# Patient Record
Sex: Male | Born: 1965 | Race: Black or African American | Hispanic: No | Marital: Married | Smoking: Never smoker
Health system: Southern US, Community
[De-identification: ages and names within clinical notes are randomized; demographics above are authoritative.]

## PROBLEM LIST (undated history)

## (undated) ENCOUNTER — Ambulatory Visit (HOSPITAL_COMMUNITY): Payer: BLUE CROSS/BLUE SHIELD | Source: Home / Self Care

## (undated) DIAGNOSIS — K219 Gastro-esophageal reflux disease without esophagitis: Secondary | ICD-10-CM

## (undated) DIAGNOSIS — I10 Essential (primary) hypertension: Secondary | ICD-10-CM

## (undated) DIAGNOSIS — F419 Anxiety disorder, unspecified: Secondary | ICD-10-CM

---

## 2000-05-25 ENCOUNTER — Encounter: Payer: Self-pay | Admitting: Family Medicine

## 2000-05-25 ENCOUNTER — Ambulatory Visit (HOSPITAL_COMMUNITY): Admission: RE | Admit: 2000-05-25 | Discharge: 2000-05-25 | Payer: Self-pay | Admitting: Family Medicine

## 2001-11-18 ENCOUNTER — Ambulatory Visit (HOSPITAL_COMMUNITY): Admission: RE | Admit: 2001-11-18 | Discharge: 2001-11-18 | Payer: Self-pay | Admitting: Family Medicine

## 2001-11-18 ENCOUNTER — Encounter: Payer: Self-pay | Admitting: Family Medicine

## 2011-09-17 ENCOUNTER — Inpatient Hospital Stay (HOSPITAL_BASED_OUTPATIENT_CLINIC_OR_DEPARTMENT_OTHER)
Admission: EM | Admit: 2011-09-17 | Discharge: 2011-09-18 | DRG: 183 | Disposition: A | Discharge status: Home / Self Care | Payer: BC Managed Care – PPO | Attending: Cardiology | Admitting: Cardiology

## 2011-09-17 ENCOUNTER — Emergency Department (HOSPITAL_BASED_OUTPATIENT_CLINIC_OR_DEPARTMENT_OTHER): Payer: BC Managed Care – PPO

## 2011-09-17 ENCOUNTER — Encounter (HOSPITAL_BASED_OUTPATIENT_CLINIC_OR_DEPARTMENT_OTHER): Payer: Self-pay | Admitting: Family Medicine

## 2011-09-17 DIAGNOSIS — R7989 Other specified abnormal findings of blood chemistry: Secondary | ICD-10-CM | POA: Diagnosis present

## 2011-09-17 DIAGNOSIS — R079 Chest pain, unspecified: Secondary | ICD-10-CM

## 2011-09-17 DIAGNOSIS — Z8612 Personal history of poliomyelitis: Secondary | ICD-10-CM

## 2011-09-17 DIAGNOSIS — K219 Gastro-esophageal reflux disease without esophagitis: Principal | ICD-10-CM | POA: Diagnosis present

## 2011-09-17 DIAGNOSIS — E7800 Pure hypercholesterolemia, unspecified: Secondary | ICD-10-CM | POA: Diagnosis present

## 2011-09-17 HISTORY — DX: Gastro-esophageal reflux disease without esophagitis: K21.9

## 2011-09-17 LAB — APTT: aPTT: 117 seconds — ABNORMAL HIGH (ref 24–37)

## 2011-09-17 LAB — CBC WITH DIFFERENTIAL/PLATELET
Basophils Absolute: 0 10*3/uL (ref 0.0–0.1)
Basophils Absolute: 0 10*3/uL (ref 0.0–0.1)
Basophils Relative: 0 % (ref 0–1)
Eosinophils Absolute: 0.1 10*3/uL (ref 0.0–0.7)
Eosinophils Relative: 2 % (ref 0–5)
Eosinophils Relative: 1 % (ref 0–5)
HCT: 37.6 % — ABNORMAL LOW (ref 39.0–52.0)
HCT: 37.2 % — ABNORMAL LOW (ref 39.0–52.0)
Hemoglobin: 13.5 g/dL (ref 13.0–17.0)
Lymphocytes Relative: 38 % (ref 12–46)
Lymphs Abs: 2 10*3/uL (ref 0.7–4.0)
MCH: 27.2 pg (ref 26.0–34.0)
MCHC: 35.9 g/dL (ref 30.0–36.0)
MCV: 75.7 fL — ABNORMAL LOW (ref 78.0–100.0)
MCV: 77.2 fL — ABNORMAL LOW (ref 78.0–100.0)
Monocytes Absolute: 0.6 10*3/uL (ref 0.1–1.0)
Monocytes Absolute: 0.3 10*3/uL (ref 0.1–1.0)
Monocytes Relative: 10 % (ref 3–12)
Neutro Abs: 2.9 10*3/uL (ref 1.7–7.7)
Neutro Abs: 2.8 10*3/uL (ref 1.7–7.7)
Platelets: 224 10*3/uL (ref 150–400)
RBC: 4.82 MIL/uL (ref 4.22–5.81)
RDW: 12.5 % (ref 11.5–15.5)
RDW: 12.8 % (ref 11.5–15.5)
WBC: 5.2 10*3/uL (ref 4.0–10.5)

## 2011-09-17 LAB — COMPREHENSIVE METABOLIC PANEL
AST: 33 U/L (ref 0–37)
Albumin: 4.3 g/dL (ref 3.5–5.2)
Albumin: 3.8 g/dL (ref 3.5–5.2)
Alkaline Phosphatase: 60 U/L (ref 39–117)
Alkaline Phosphatase: 50 U/L (ref 39–117)
BUN: 10 mg/dL (ref 6–23)
CO2: 29 mEq/L (ref 19–32)
CO2: 27 mEq/L (ref 19–32)
Chloride: 100 mEq/L (ref 96–112)
Chloride: 105 mEq/L (ref 96–112)
Creatinine, Ser: 0.8 mg/dL (ref 0.50–1.35)
Creatinine, Ser: 0.64 mg/dL (ref 0.50–1.35)
GFR calc non Af Amer: >90 mL/min (ref >90)
GFR calc non Af Amer: >90 mL/min (ref >90)
Glucose, Bld: 79 mg/dL (ref 70–99)
Potassium: 3.9 mEq/L (ref 3.5–5.1)
Potassium: 3.9 mEq/L (ref 3.5–5.1)
Total Bilirubin: 0.4 mg/dL (ref 0.3–1.2)
Total Bilirubin: 0.5 mg/dL (ref 0.3–1.2)

## 2011-09-17 LAB — CARDIAC PANEL(CRET KIN+CKTOT+MB+TROPI): Relative Index: 0.9 (ref 0.0–2.5)

## 2011-09-17 LAB — LIPASE, BLOOD: Lipase: 30 U/L (ref 11–59)

## 2011-09-17 MED ORDER — NITROGLYCERIN 0.4 MG SL SUBL
0.4000 mg | SUBLINGUAL_TABLET | SUBLINGUAL | Status: DC | PRN
  Administered 2011-09-17 (×2): 0.4 mg via SUBLINGUAL
  Filled 2011-09-17: qty 25

## 2011-09-17 MED ORDER — ASPIRIN 81 MG PO CHEW
324.0000 mg | CHEWABLE_TABLET | ORAL | Status: AC
  Administered 2011-09-17: 324 mg via ORAL
  Filled 2011-09-17: qty 4

## 2011-09-17 MED ORDER — NITROGLYCERIN 0.4 MG SL SUBL: 0.4000 mg | SUBLINGUAL_TABLET | SUBLINGUAL | Status: DC | PRN

## 2011-09-17 MED ORDER — ACETAMINOPHEN 325 MG PO TABS: 650.0000 mg | ORAL_TABLET | ORAL | Status: DC | PRN

## 2011-09-17 MED ORDER — ASPIRIN EC 81 MG PO TBEC
81.0000 mg | DELAYED_RELEASE_TABLET | Freq: Every day | ORAL | Status: DC
  Filled 2011-09-17: qty 1

## 2011-09-17 MED ORDER — ASPIRIN 81 MG PO CHEW
324.0000 mg | CHEWABLE_TABLET | Freq: Once | ORAL | Status: AC
  Administered 2011-09-17: 324 mg via ORAL
  Filled 2011-09-17: qty 4

## 2011-09-17 MED ORDER — SODIUM CHLORIDE 0.9 % IV SOLN
INTRAVENOUS | Status: DC
  Administered 2011-09-17: 22:00:00 via INTRAVENOUS

## 2011-09-17 MED ORDER — LORAZEPAM 2 MG/ML IJ SOLN
1.0000 mg | Freq: Once | INTRAMUSCULAR | Status: AC
  Administered 2011-09-17: 1 mg via INTRAVENOUS
  Filled 2011-09-17: qty 1

## 2011-09-17 MED ORDER — METOPROLOL TARTRATE 50 MG PO TABS
ORAL_TABLET | ORAL | Status: AC
  Administered 2011-09-17: 12.5 mg via ORAL
  Filled 2011-09-17: qty 1

## 2011-09-17 MED ORDER — NITROGLYCERIN IN D5W 200-5 MCG/ML-% IV SOLN
2.0000 ug/min | Freq: Once | INTRAVENOUS | Status: AC
  Administered 2011-09-17: 5 ug/min via INTRAVENOUS
  Filled 2011-09-17: qty 250

## 2011-09-17 MED ORDER — ATORVASTATIN CALCIUM 40 MG PO TABS
40.0000 mg | ORAL_TABLET | Freq: Every day | ORAL | Status: DC
  Filled 2011-09-17: qty 1

## 2011-09-17 MED ORDER — ASPIRIN 300 MG RE SUPP
300.0000 mg | RECTAL | Status: AC
  Filled 2011-09-17: qty 1

## 2011-09-17 MED ORDER — DIPHENHYDRAMINE HCL 50 MG/ML IJ SOLN: 25.0000 mg | Freq: Once | INTRAMUSCULAR | Status: DC

## 2011-09-17 MED ORDER — HEPARIN (PORCINE) IN NACL 100-0.45 UNIT/ML-% IJ SOLN
1350.0000 [IU]/h | INTRAMUSCULAR | Status: DC
  Administered 2011-09-17: 1000 [IU]/h via INTRAVENOUS
  Filled 2011-09-17 (×2): qty 250

## 2011-09-17 MED ORDER — HEPARIN BOLUS VIA INFUSION
4000.0000 [IU] | Freq: Once | INTRAVENOUS | Status: AC
  Administered 2011-09-17: 4000 [IU] via INTRAVENOUS

## 2011-09-17 MED ORDER — ONDANSETRON HCL 4 MG/2ML IJ SOLN: 4.0000 mg | Freq: Four times a day (QID) | INTRAMUSCULAR | Status: DC | PRN

## 2011-09-17 MED ORDER — METOPROLOL TARTRATE 12.5 MG HALF TABLET
12.5000 mg | ORAL_TABLET | Freq: Once | ORAL | Status: AC
  Administered 2011-09-17: 12.5 mg via ORAL
  Filled 2011-09-17: qty 1

## 2011-09-17 NOTE — ED Notes (Addendum)
Pt c/o intermittent chest pain x 5 days that he describes as heaviness radiating down left arm. Pt sts he has h/o gerd and has been taking prilosec which helps pain but does not "fix". Pt denies shob, n/v, dizziness. Pt sts eating makes pain better and pain is worse when hungry.

## 2011-09-17 NOTE — Progress Notes (Signed)
ANTICOAGULATION CONSULT NOTE - Initial Consult  Pharmacy Consult for UFH Indication: USAP  No Known Allergies  Patient Measurements: Height: 5\' 9"  (175.3 cm) Weight: 208 lb (94.348 kg) IBW/kg (Calculated) : 70.7  Heparin Dosing Weight: 90kg  Vital Signs: Temp: 98.2 F (36.8 C) (08/21 1340) Temp src: Oral (08/21 1340) BP: 107/72 mmHg (08/21 1340) Pulse Rate: 65  (08/21 1340)  Labs:  Basename 09/17/11 1152  HGB 13.5  HCT 37.6*  PLT 249  APTT --  LABPROT --  INR --  HEPARINUNFRC --  CREATININE 0.80  CKTOTAL 1330*  CKMB 12.7*  TROPONINI <0.30    Estimated Creatinine Clearance: 132.1 ml/min (by C-G formula based on Cr of 0.8).   Medical History: Past Medical History  Diagnosis Date  . GERD (gastroesophageal reflux disease)     Medications:   (Not in a hospital admission)  Assessment: 46 y/o male patient admitted with chest pain requiring anticoagulation for r/o MI.  Goal of Therapy:  Heparin level 0.3-0.7 units/ml Monitor platelets by anticoagulation protocol: Yes   Plan:  Heparin 4000 unit IV bolus followed by infusion at 1000 units/hr and check 6 hour level. Daily cbc and heparin level.  Verlene Mayer, PharmD, BCPS Pager [phone] 09/17/2011,3:31 PM

## 2011-09-17 NOTE — H&P (Signed)
[name] is an 46 y.o. male.   Chief Complaint: Chest pain/left arm pain HPI: Patient is 46 year old male with past medical history significant for GERD was transferred from [facility] med center for evaluation of chest pain. Patient complains of retrosternal chest pain described as pressure tightness off and on since last to 3 days also associated epigastric pain initially took Prilosec with relief but again gallop retrosternal chest pressure tightness grade 8/10 radiating to back of the neck and left arm received sublingual nitroglycerin with relief of chest pain. Patient denies any shortness of breath. Denies any palpitation lightheadedness or syncope. Denies nausea vomiting sweating. Denies any cardiac workup in the past. Patient denies history of exertional chest pain in the past. Denies cough fever chills. Denies relation of chest pain to food breathing or movement.   Past Medical History  Diagnosis Date  . GERD (gastroesophageal reflux disease)     History reviewed. No pertinent past surgical history.  No family history on file. Social History:  reports that he has never smoked. He does not have any smokeless tobacco history on file. He reports that he drinks alcohol. He reports that he does not use illicit drugs.  Allergies: No Known Allergies  Medications Prior to Admission  Medication Sig Dispense Refill  . calcium carbonate (TUMS - DOSED IN MG ELEMENTAL CALCIUM) 500 MG chewable tablet Chew 3 tablets by mouth daily as needed. Stomach upset      . ibuprofen (ADVIL,MOTRIN) 200 MG tablet Take 800 mg by mouth daily as needed. pain      . omeprazole (PRILOSEC) 10 MG capsule Take 10 mg by mouth daily.      . vitamin C (ASCORBIC ACID) 500 MG tablet Take 1,000 mg by mouth daily.        Results for orders placed during the hospital encounter of 09/17/11 (from the past 48 hour(s))  CBC WITH DIFFERENTIAL     Status: Abnormal   Collection Time   09/17/11 11:52 AM      Component Value  Range Comment   WBC 5.6  4.0 - 10.5 K/uL    RBC 4.97  4.22 - 5.81 MIL/uL    Hemoglobin 13.5  13.0 - 17.0 g/dL    HCT 16.1 (*) 09.6 - 52.0 %    MCV 75.7 (*) 78.0 - 100.0 fL    MCH 27.2  26.0 - 34.0 pg    MCHC 35.9  30.0 - 36.0 g/dL    RDW 04.5  40.9 - 81.1 %    Platelets 249  150 - 400 K/uL    Neutrophils Relative 51  43 - 77 %    Neutro Abs 2.9  1.7 - 7.7 K/uL    Lymphocytes Relative 36  12 - 46 %    Lymphs Abs 2.0  0.7 - 4.0 K/uL    Monocytes Relative 10  3 - 12 %    Monocytes Absolute 0.6  0.1 - 1.0 K/uL    Eosinophils Relative 2  0 - 5 %    Eosinophils Absolute 0.1  0.0 - 0.7 K/uL    Basophils Relative 0  0 - 1 %    Basophils Absolute 0.0  0.0 - 0.1 K/uL   TROPONIN I     Status: Normal   Collection Time   09/17/11 11:52 AM      Component Value Range Comment   Troponin I <0.30  <0.30 ng/mL   CK TOTAL AND CKMB     Status: Abnormal  Collection Time   09/17/11 11:52 AM      Component Value Range Comment   Total CK 1330 (*) 7 - 232 U/L    CK, MB 12.7 (*) 0.3 - 4.0 ng/mL    Relative Index 1.0  0.0 - 2.5   COMPREHENSIVE METABOLIC PANEL     Status: Abnormal   Collection Time   09/17/11 11:52 AM      Component Value Range Comment   Sodium 137  135 - 145 mEq/L    Potassium 3.9  3.5 - 5.1 mEq/L    Chloride 100  96 - 112 mEq/L    CO2 29  19 - 32 mEq/L    Glucose, Bld 79  70 - 99 mg/dL    BUN 10  6 - 23 mg/dL    Creatinine, Ser 4.78  0.50 - 1.35 mg/dL    Calcium 29.5  8.4 - 10.5 mg/dL    Total Protein 7.4  6.0 - 8.3 g/dL    Albumin 4.3  3.5 - 5.2 g/dL    AST 39 (*) 0 - 37 U/L    ALT 41  0 - 53 U/L    Alkaline Phosphatase 60  39 - 117 U/L    Total Bilirubin 0.4  0.3 - 1.2 mg/dL    GFR calc non Af Amer >90  >90 mL/min    GFR calc Af Amer >90  >90 mL/min   LIPASE, BLOOD     Status: Normal   Collection Time   09/17/11 11:55 AM      Component Value Range Comment   Lipase 30  11 - 59 U/L    Dg Chest 2 View  09/17/2011  *RADIOLOGY REPORT*  Clinical Data: Chest pain, tightness.   CHEST - 2 VIEW  Comparison: None  Findings: Heart and mediastinal contours are within normal limits. No focal opacities or effusions.  No acute bony abnormality.  IMPRESSION: No active cardiopulmonary disease.   Original Report Authenticated By: Cyndie Chime, M.D.     Review of Systems  Constitutional: Negative for fever, chills and weight loss.  HENT: Negative for hearing loss.   Eyes: Negative for blurred vision and double vision.  Respiratory: Negative for cough, hemoptysis and sputum production.   Cardiovascular: Positive for chest pain and palpitations. Negative for orthopnea, claudication and leg swelling.  Gastrointestinal: Positive for heartburn, nausea and abdominal pain. Negative for vomiting.  Genitourinary: Negative for dysuria.  Skin: Negative for rash.  Neurological: Negative for dizziness and headaches.    Blood pressure 135/84, pulse 72, temperature 98.2 F (36.8 C), temperature source Oral, resp. rate 17, height 5\' 9"  (1.753 m), weight 94.348 kg (208 lb), SpO2 100.00%. Physical Exam  Constitutional: He is oriented to person, place, and time. He appears well-developed and well-nourished. No distress.  HENT:  Head: Normocephalic and atraumatic.  Eyes: Conjunctivae are normal. Pupils are equal, round, and reactive to light. Left eye exhibits no discharge. No scleral icterus.  Neck: Normal range of motion. Neck supple. No JVD present. No tracheal deviation present. No thyromegaly present.  Cardiovascular: Normal rate, regular rhythm and normal heart sounds.  Exam reveals no friction rub.   No murmur heard. Respiratory: Effort normal and breath sounds normal. No respiratory distress. He has no wheezes. He has no rales.  GI: Soft. Bowel sounds are normal. He exhibits no distension. There is no tenderness. There is no rebound and no guarding.  Musculoskeletal: He exhibits no edema and no tenderness.  Neurological: He is alert  and oriented to person, place, and time.      Assessment/Plan Unstable angina rule out MI GERD Plan As per orders Schedule for a stress Myoview in a.m.  Robynn Pane 09/17/2011, 6:48 PM

## 2011-09-17 NOTE — ED Provider Notes (Addendum)
History     CSN: [id number]  Arrival date & time 09/17/11  1122   First MD Initiated Contact with Patient 09/17/11 1159      Chief Complaint  Patient presents with  . Chest Pain    (Consider location/radiation/quality/duration/timing/severity/associated sxs/prior treatment) HPI Patient complaining of chest pain described as heaviness which radiates to his back and down his left arm. It began on Saturday and waxed and waned until yesterday. The pain improved yesterday after taking Prilosec. It has returned and he has taken Prilosec again and is currently at a 6/10 pain that was previously at an 8/10. He denies any shortness of breath or sweating. He thinks it has been increased by certain foods but cannot tell whether it has been or not. He spoke with his physician today and was told to come into the hospital. He denies any history of high blood pressure, high cholesterol, or known family history of heart disease although his father has passed away and he does not know the details of his medical history. Patient is not a smoker he does use occasional alcohol  Past Medical History  Diagnosis Date  . GERD (gastroesophageal reflux disease)     History reviewed. No pertinent past surgical history.  No family history on file.  History  Substance Use Topics  . Smoking status: Never Smoker   . Smokeless tobacco: Not on file  . Alcohol Use: Yes      Review of Systems  All other systems reviewed and are negative.    Allergies  Review of patient's allergies indicates no known allergies.  Home Medications   Current Outpatient Rx  Name Route Sig Dispense Refill  . TUMS E-X PO Oral Take by mouth.    . OMEPRAZOLE 40 MG PO CPDR Oral Take 40 mg by mouth daily.      BP 107/72  Pulse 65  Temp 98.2 F (36.8 C) (Oral)  Resp 16  Ht 5\' 9"  (1.753 m)  Wt 208 lb (94.348 kg)  BMI 30.72 kg/m2  SpO2 100%  Physical Exam  Nursing note and vitals reviewed. Constitutional: He is  oriented to person, place, and time. He appears well-developed and well-nourished.  HENT:  Head: Normocephalic and atraumatic.  Right Ear: External ear normal.  Left Ear: External ear normal.  Nose: Nose normal.  Mouth/Throat: Oropharynx is clear and moist.  Eyes: Conjunctivae and EOM are normal. Pupils are equal, round, and reactive to light.  Neck: Normal range of motion. Neck supple.  Cardiovascular: Normal rate, regular rhythm, normal heart sounds and intact distal pulses.   Pulmonary/Chest: Effort normal and breath sounds normal.  Abdominal: Soft. Bowel sounds are normal.  Musculoskeletal: Normal range of motion.  Neurological: He is alert and oriented to person, place, and time. He has normal reflexes.  Skin: Skin is warm and dry.  Psychiatric: He has a normal mood and affect. His behavior is normal. Thought content normal.    ED Course  Procedures (including critical care time)  Labs Reviewed  CBC WITH DIFFERENTIAL - Abnormal; Notable for the following:    HCT 37.6 (*)     MCV 75.7 (*)     All other components within normal limits  CK TOTAL AND CKMB - Abnormal; Notable for the following:    Total CK 1330 (*)     CK, MB 12.7 (*)     All other components within normal limits  COMPREHENSIVE METABOLIC PANEL - Abnormal; Notable for the following:    AST 39 (*)  All other components within normal limits  TROPONIN I  LIPASE, BLOOD   Dg Chest 2 View  09/17/2011  *RADIOLOGY REPORT*  Clinical Data: Chest pain, tightness.  CHEST - 2 VIEW  Comparison: None  Findings: Heart and mediastinal contours are within normal limits. No focal opacities or effusions.  No acute bony abnormality.  IMPRESSION: No active cardiopulmonary disease.   Original Report Authenticated By: Cyndie Chime, M.D.      No diagnosis found.   Date: 11/04/2011  Rate: 69  Rhythm: normal sinus rhythm  QRS Axis: normal  Intervals: normal  ST/T Wave abnormalities: ST elevations diffusely  Conduction  Disutrbances:none  Narrative Interpretation:   Old EKG Reviewed: none available    MDM    Patient with intermittent chest pain concerning for cad, but nonexertional and not brought on by exertion.  Patient pain free after nitro here, ck and mb elevated but troponin normal.  Discussed with Dr. Sharyn Lull and nitro, drip, heparin, and lopressor ordered.  Plan transfer for further assessment by cardiologist.       Hilario Quarry, MD 09/19/11 1610  Hilario Quarry, MD 11/04/11 [id number]

## 2011-09-18 ENCOUNTER — Inpatient Hospital Stay (HOSPITAL_COMMUNITY): Payer: BC Managed Care – PPO

## 2011-09-18 LAB — CARDIAC PANEL(CRET KIN+CKTOT+MB+TROPI)
CK, MB: 7.4 ng/mL (ref 0.3–4.0)
Relative Index: 1 (ref 0.0–2.5)
Total CK: 757 U/L — ABNORMAL HIGH (ref 7–232)
Total CK: 627 U/L — ABNORMAL HIGH (ref 7–232)
Troponin I: <0.3 ng/mL (ref <0.30)

## 2011-09-18 LAB — CBC
HCT: 38.9 % — ABNORMAL LOW (ref 39.0–52.0)
Hemoglobin: 13.5 g/dL (ref 13.0–17.0)
MCH: 27.2 pg (ref 26.0–34.0)
MCHC: 34.7 g/dL (ref 30.0–36.0)
MCV: 78.3 fL (ref 78.0–100.0)
RDW: 12.8 % (ref 11.5–15.5)

## 2011-09-18 LAB — TSH: TSH: 2.723 u[IU]/mL (ref 0.350–4.500)

## 2011-09-18 MED ORDER — ATORVASTATIN CALCIUM 40 MG PO TABS: 40.0000 mg | ORAL_TABLET | Freq: Every day | ORAL | Status: AC

## 2011-09-18 MED ORDER — HEPARIN BOLUS VIA INFUSION
3000.0000 [IU] | Freq: Once | INTRAVENOUS | Status: DC
  Filled 2011-09-18: qty 3000

## 2011-09-18 MED ORDER — OMEPRAZOLE 10 MG PO CPDR: 40.0000 mg | DELAYED_RELEASE_CAPSULE | Freq: Every day | ORAL | Status: DC

## 2011-09-18 MED ORDER — TECHNETIUM TC 99M TETROFOSMIN IV KIT
10.0000 | PACK | Freq: Once | INTRAVENOUS | Status: AC | PRN
  Administered 2011-09-18: 10 via INTRAVENOUS

## 2011-09-18 MED ORDER — TECHNETIUM TC 99M TETROFOSMIN IV KIT
30.0000 | PACK | Freq: Once | INTRAVENOUS | Status: AC | PRN
  Administered 2011-09-18: 30 via INTRAVENOUS

## 2011-09-18 MED ORDER — REGADENOSON 0.4 MG/5ML IV SOLN
0.4000 mg | Freq: Once | INTRAVENOUS | Status: AC
  Administered 2011-09-18: 0.4 mg via INTRAVENOUS

## 2011-09-18 MED ORDER — ASPIRIN 81 MG PO TBEC: 81.0000 mg | DELAYED_RELEASE_TABLET | Freq: Every day | ORAL | Status: AC

## 2011-09-18 NOTE — Progress Notes (Signed)
ANTICOAGULATION CONSULT NOTE - Follow Up Consult  Pharmacy Consult for Heparin Indication: Unstable angina pain, rule out MI  Allergies No Known Allergies  Patient Measurements: Heparin Dosing Weight:90 kg  Vital Signs: BP 111/68  Pulse 67  Temp 97.9 F (36.6 C) (Oral)  Resp 13  Ht 5\' 9"  (1.753 m)  Wt 210 lb 1.6 oz (95.3 kg)  BMI 31.03 kg/m2  SpO2 100%  Labs:  Basename 09/18/11 0852 09/18/11 0815 09/18/11 0814 09/18/11 0103 09/17/11 1923 09/17/11 1922 09/17/11 1152  HGB 13.5 -- -- -- 13.1 -- --  HCT 38.9* -- -- -- 37.2* -- 37.6*  PLT 205 -- -- -- 224 -- 249  APTT -- -- -- -- 117* -- --  LABPROT -- -- -- -- 13.8 -- --  INR -- -- -- -- 1.04 -- --  HEPARINUNFRC -- -- <0.10* -- -- -- --  CREATININE -- -- -- -- 0.64 -- 0.80  CKTOTAL -- 627* -- 757* -- 920* --  CKMB -- 7.2* -- 7.4* -- 8.6* --  TROPONINI -- <0.30 -- <0.30 -- <0.30 --   Estimated Creatinine Clearance: 132.8 ml/min (by C-G formula based on Cr of 0.64).  Medications:  Scheduled:    . aspirin EC  81 mg Oral Daily  . atorvastatin  40 mg Oral q1800   Infusions:    . heparin 1,000 Units/hr (09/17/11 2142)    Assessment:  46 y/o male patient admitted with chest pain requiring anticoagulation for r/o MI.  Heparin level reported as < 0.1 unit/ml >> SUBtherapeutic  Goal of Therapy:   Heparin level 0.3-0.7 units/ml   Plan:   Give 3000 units bolus x 1  Increase heparin infusion to 1350 units/hr  Check anti-Xa level in 6 hours and daily while on heparin  Continue to monitor H&H and platelets  Josilynn Losh J  Pharm.D 09/18/2011, 9:48 AM

## 2011-09-18 NOTE — Progress Notes (Signed)
Subjective:  Denies further chest pain or shortness of breath nuclear stress test results pending  Objective:  Vital Signs in the last 24 hours: Temp:  [97.9 F (36.6 C)-98.3 F (36.8 C)] 97.9 F (36.6 C) (08/22 0747) Pulse Rate:  [56-77] 77  (08/22 0800) Resp:  [13-18] 16  (08/22 0800) BP: (100-135)/(54-87) 113/61 mmHg (08/22 1252) SpO2:  [99 %-100 %] 100 % (08/22 0800) Weight:  [95.3 kg (210 lb 1.6 oz)] 95.3 kg (210 lb 1.6 oz) (08/22 0500)  Intake/Output from previous day: 08/21 0701 - 08/22 0700 In: 290 [P.O.:240; I.V.:50] Out: 625 [Urine:625] Intake/Output from this shift: Total I/O In: 240 [P.O.:240] Out: -   Physical Exam: Neck: no adenopathy, no carotid bruit, no JVD and supple, symmetrical, trachea midline Lungs: clear to auscultation bilaterally Heart: regular rate and rhythm, S1, S2 normal, no murmur, click, rub or gallop Abdomen: soft, non-tender; bowel sounds normal; no masses,  no organomegaly Extremities: extremities normal, atraumatic, no cyanosis or edema  Lab Results:  Basename 09/18/11 0852 09/17/11 1923  WBC 5.1 5.2  HGB 13.5 13.1  PLT 205 224    Basename 09/17/11 1923 09/17/11 1152  NA 141 137  K 3.9 3.9  CL 105 100  CO2 27 29  GLUCOSE 84 79  BUN 8 10  CREATININE 0.64 0.80    Basename 09/18/11 0815 09/18/11 0103  TROPONINI <0.30 <0.30   Hepatic Function Panel  Basename 09/17/11 1923  PROT 6.7  ALBUMIN 3.8  AST 33  ALT 36  ALKPHOS 50  BILITOT 0.5  BILIDIR --  IBILI --   No results found for this basename: CHOL in the last 72 hours No results found for this basename: PROTIME in the last 72 hours  Imaging: Imaging results have been reviewed and Dg Chest 2 View  09/17/2011  *RADIOLOGY REPORT*  Clinical Data: Chest pain, tightness.  CHEST - 2 VIEW  Comparison: None  Findings: Heart and mediastinal contours are within normal limits. No focal opacities or effusions.  No acute bony abnormality.  IMPRESSION: No active cardiopulmonary  disease.   Original Report Authenticated By: Cyndie Chime, M.D.    Nm Myocar Multi W/spect W/wall Motion / Ef  09/18/2011  *RADIOLOGY REPORT*  Clinical Data:  Chest pain  MYOCARDIAL IMAGING WITH SPECT (REST AND PHARMACOLOGIC-STRESS) GATED LEFT VENTRICULAR WALL MOTION STUDY LEFT VENTRICULAR EJECTION FRACTION  Technique:  Standard myocardial SPECT imaging was performed after resting intravenous injection of 10 mCi Tc-98m tetrofosmin. Subsequently, intravenous infusion of Lexiscan was performed under the supervision of the Cardiology staff.  At peak effect of the drug, 30 mCi Tc-61m tetrofosmin was injected intravenously and standard myocardial SPECT  imaging was performed.  Quantitative gated imaging was also performed to evaluate left ventricular wall motion, and estimate left ventricular ejection fraction.  Comparison:  None  Findings: SPECT imaging demonstrates no reversible or irreversible defects to suggest ischemia or infarct.  Quantitative gated analysis shows normal wall motion.  The resting left ventricular ejection fraction is 56% with end- diastolic volume of 87 ml and end-systolic volume of 39 ml.  IMPRESSION: No evidence of ischemia or infarct.  Ejection fraction 56%.   Original Report Authenticated By: Cyndie Chime, M.D.     Cardiac Studies:  Assessment/Plan:  Unstable angina MI ruled out Elevated CPK MB and CK secondary to noncardiac source GERD  History of polio in the past  Plan Check stress test results if negative for ischemia will DC home  LOS: 1 day  Robynn Pane 09/18/2011, 3:49 PM

## 2011-09-18 NOTE — Discharge Summary (Signed)
  Discharge summary dictated on 09/18/2011 dictation number is [id number]

## 2011-09-19 NOTE — Discharge Summary (Signed)
[name], [name] NO.:  [id number]  MEDICAL RECORD NO.:  [id number]  LOCATION:  2507                         FACILITY:  [facility]  PHYSICIAN:  Eduardo Osier. Sharyn Lull, M.D. DATE OF BIRTH:  29-Mar-1965  DATE OF ADMISSION:  09/17/2011 DATE OF DISCHARGE:  09/18/2011                              DISCHARGE SUMMARY   ADMITTING DIAGNOSES: 1. Unstable angina, rule out myocardial infarction. 2. Gastroesophageal reflux disease.  DISCHARGE DIAGNOSES: 1. Status post chest pain, myocardial infarction ruled out, negative     nuclear stress test, rule out gastroesophageal reflux disease. 2. Elevated CPK, MB and CK secondary to noncardiac source. 3. Hypercholesteremia. 4. Gastroesophageal reflux disease. 5. History of polio in the past.  DISCHARGE HOME MEDICATIONS: 1. Enteric-coated aspirin 81 mg 1 tablet daily. 2. Atorvastatin 40 mg 1 tablet daily. 3. Omeprazole 40 mg daily. 4. Calcium carbonate 500 mg as before. 5. Vitamin C 500 mg as before.  The patient has been advised to stop     ibuprofen.  DIET:  Low salt, low cholesterol.  ACTIVITY:  As tolerated.  CONDITION AT DISCHARGE:  Stable.  BRIEF HISTORY AND HOSPITAL COURSE:  [name] is a 46 year old male with past medical history significant for GERD, who was transferred from [facility] for evaluation of chest pain.  The patient complained of retrosternal chest pain, described as pressure, tightness off and on for last 3 days, also associated epigastric pain, initially took Prilosec with some relief.  Again, he developed retrosternal chest pressure, tightness, grade 8/10 radiating to the back of the neck and left arm, received sublingual nitro with relief of chest pain.  The patient denies any shortness of breath.  Denies palpitation, lightheadedness, or syncope.  Denies any nausea, vomiting, or diaphoresis.  Denies any cardiac workup in the past.  Denies history of exertional chest pain in the past.   Denies cough, fever, or chills. Denies relation of chest pain to food, breathing, or movement.  PAST MEDICAL HISTORY:  As above.  PAST SURGICAL HISTORY:  None.  SOCIAL HISTORY:  No history of smoking or alcohol abuse.  MEDICATION AT HOME:  He takes calcium carbonate, ibuprofen, omeprazole, and vitamin C.  PHYSICAL EXAMINATION:  VITAL SIGNS:  His blood pressure was 135/84, pulse was 72.  He was afebrile. HEENT:  Conjunctiva was pink. NECK:  Supple.  No JVD.  No bruit. LUNGS:  Clear to auscultation without rhonchi or rales. CARDIOVASCULAR:  S1, S2 was normal.  There was no murmur, or S3 gallop or S4 gallop. ABDOMEN:  Soft.  Bowel sounds were present.  Nontender.  There was no mass or organomegaly. EXTREMITIES:  There was no clubbing, cyanosis, or edema. NEUROLOGIC:  Grossly intact.  LABORATORY DATA:  Sodium was 139, potassium 3.9, BUN 10, creatinine 0.80.  His four sets of troponin-I were normal; however, CK was elevated at 1330, MB 12.7.  Second set; CK 920, MB 8.6.  Third set; CK 757, MB 7.4, relative index in normal range.  Fourth set; CK 627, MB 7.2. Hemoglobin was 13.5, hematocrit 37.6, white count of 5.6 with no shift to the left.  Lipid panel is still pending, not available.  His chest x- ray showed no  active cardiopulmonary disease.  EKG showed normal sinus rhythm with LVH with early repolarization changes.  Nuclear stress test showed no evidence of ischemia or infarction with normal ejection fraction of 56%.  BRIEF HOSPITAL COURSE:  The patient was admitted to telemetry unit.  MI was ruled out by serial enzymes and EKG.  The patient did not have any episodes of chest pain during the hospital stay.  The patient subsequently underwent Lexiscan Myoview stress test, which showed no evidence of ischemia or infarction with normal LV function.  The patient has been ambulating in room without any problems.  The patient will be discharged home on above medications and will be  followed up in my office in 1 week.     Eduardo Osier. Sharyn Lull, M.D.     [facility]MED[facility]  D:  09/18/2011  T:  09/19/2011  Job:  [id number]

## 2014-08-02 ENCOUNTER — Emergency Department (HOSPITAL_COMMUNITY): Payer: Worker's Compensation

## 2014-08-02 ENCOUNTER — Encounter (HOSPITAL_COMMUNITY): Payer: Self-pay

## 2014-08-02 ENCOUNTER — Emergency Department (HOSPITAL_COMMUNITY)
Admission: EM | Admit: 2014-08-02 | Discharge: 2014-08-02 | Disposition: A | Discharge status: Home / Self Care | Payer: Worker's Compensation | Attending: Emergency Medicine | Admitting: Emergency Medicine

## 2014-08-02 DIAGNOSIS — S62635B Displaced fracture of distal phalanx of left ring finger, initial encounter for open fracture: Secondary | ICD-10-CM | POA: Insufficient documentation

## 2014-08-02 DIAGNOSIS — Z23 Encounter for immunization: Secondary | ICD-10-CM | POA: Insufficient documentation

## 2014-08-02 DIAGNOSIS — S61219A Laceration without foreign body of unspecified finger without damage to nail, initial encounter: Secondary | ICD-10-CM

## 2014-08-02 DIAGNOSIS — S62639B Displaced fracture of distal phalanx of unspecified finger, initial encounter for open fracture: Secondary | ICD-10-CM

## 2014-08-02 DIAGNOSIS — Y990 Civilian activity done for income or pay: Secondary | ICD-10-CM | POA: Diagnosis not present

## 2014-08-02 DIAGNOSIS — W231XXA Caught, crushed, jammed, or pinched between stationary objects, initial encounter: Secondary | ICD-10-CM | POA: Diagnosis not present

## 2014-08-02 DIAGNOSIS — S61215A Laceration without foreign body of left ring finger without damage to nail, initial encounter: Secondary | ICD-10-CM | POA: Insufficient documentation

## 2014-08-02 DIAGNOSIS — Y9389 Activity, other specified: Secondary | ICD-10-CM | POA: Insufficient documentation

## 2014-08-02 DIAGNOSIS — Y9289 Other specified places as the place of occurrence of the external cause: Secondary | ICD-10-CM | POA: Diagnosis not present

## 2014-08-02 DIAGNOSIS — K219 Gastro-esophageal reflux disease without esophagitis: Secondary | ICD-10-CM | POA: Insufficient documentation

## 2014-08-02 DIAGNOSIS — S6992XA Unspecified injury of left wrist, hand and finger(s), initial encounter: Secondary | ICD-10-CM | POA: Diagnosis present

## 2014-08-02 DIAGNOSIS — Z79899 Other long term (current) drug therapy: Secondary | ICD-10-CM | POA: Insufficient documentation

## 2014-08-02 MED ORDER — TETANUS-DIPHTH-ACELL PERTUSSIS 5-2.5-18.5 LF-MCG/0.5 IM SUSP
0.5000 mL | Freq: Once | INTRAMUSCULAR | Status: AC
Start: 2014-08-02 — End: 2014-08-02
  Administered 2014-08-02: 0.5 mL via INTRAMUSCULAR
  Filled 2014-08-02: qty 0.5

## 2014-08-02 MED ORDER — IBUPROFEN 800 MG PO TABS: 800.0000 mg | ORAL_TABLET | Freq: Three times a day (TID) | ORAL | Status: AC

## 2014-08-02 MED ORDER — CEPHALEXIN 500 MG PO CAPS: 500.0000 mg | ORAL_CAPSULE | Freq: Four times a day (QID) | ORAL | Status: DC

## 2014-08-02 MED ORDER — CEFAZOLIN SODIUM 1 G IJ SOLR
2.0000 g | Freq: Once | INTRAMUSCULAR | Status: AC
  Administered 2014-08-02: 2 g via INTRAMUSCULAR
  Filled 2014-08-02: qty 20

## 2014-08-02 MED ORDER — IBUPROFEN 800 MG PO TABS
800.0000 mg | ORAL_TABLET | Freq: Once | ORAL | Status: AC
  Administered 2014-08-02: 800 mg via ORAL
  Filled 2014-08-02: qty 1

## 2014-08-02 MED ORDER — LIDOCAINE HCL 2 % IJ SOLN
15.0000 mL | Freq: Once | INTRAMUSCULAR | Status: AC
  Administered 2014-08-02: 300 mg
  Filled 2014-08-02: qty 20

## 2014-08-02 NOTE — ED Provider Notes (Signed)
CSN: [id number]     Arrival date & time 08/02/14  0050 History   First MD Initiated Contact with Patient 08/02/14 0121     Chief Complaint  Patient presents with  . Extremity Laceration    (Consider location/radiation/quality/duration/timing/severity/associated sxs/prior Treatment) HPI Comments: 49 year old male with no significant past medical history presents to the emergency department for further evaluation of injury to his left ring finger. Patient states that he dropped a roll of film which caught his finger between 2 film rolls weighing 85 pounds each. Injury caused a laceration to the distal L ring finger. Patient has had a constant pain to wound site; no meds PTA. Patient states that he has had sensation maintained in this digit. He denies any decreased ROM, but movement does aggravate his pain. No use of blood thinners. Last tetanus unknown.  The history is provided by the patient. No language interpreter was used.    Past Medical History  Diagnosis Date  . GERD (gastroesophageal reflux disease)    History reviewed. No pertinent past surgical history. No family history on file. History  Substance Use Topics  . Smoking status: Never Smoker   . Smokeless tobacco: Not on file  . Alcohol Use: Yes    Review of Systems  Musculoskeletal: Positive for myalgias and arthralgias.  Skin: Positive for wound.  Neurological: Negative for numbness.  All other systems reviewed and are negative.   Allergies  Review of patient's allergies indicates no known allergies.  Home Medications   Prior to Admission medications   Medication Sig Start Date End Date Taking? Authorizing Provider  ibuprofen (ADVIL,MOTRIN) 200 MG tablet Take 400 mg by mouth every 6 (six) hours as needed for moderate pain.   Yes Historical Provider, MD  atorvastatin (LIPITOR) 40 MG tablet Take 1 tablet (40 mg total) by mouth daily at 6 PM. 09/18/11 09/17/12  Rinaldo Cloud, MD  omeprazole (PRILOSEC) 10 MG capsule Take  4 capsules (40 mg total) by mouth daily. 09/18/11   Rinaldo Cloud, MD   BP 130/91 mmHg  Pulse 89  Temp(Src) 98.4 F (36.9 C) (Oral)  Resp 16  Ht  (1.727 m)  Wt 205 lb (92.987 kg)  BMI 31.18 kg/m2  SpO2 97%   Physical Exam  Constitutional: He is oriented to person, place, and time. He appears well-developed and well-nourished. No distress.  HENT:  Head: Normocephalic and atraumatic.  Eyes: Conjunctivae and EOM are normal. No scleral icterus.  Neck: Normal range of motion.  Cardiovascular: Normal rate, regular rhythm and intact distal pulses.   Distal radial pulse 2+ in the LUE; capillary refill <2 seconds in distal tip of all digits of L hand.  Pulmonary/Chest: Effort normal. No respiratory distress.  Musculoskeletal: Normal range of motion.       Left hand: He exhibits tenderness, bony tenderness, laceration and swelling (mild). He exhibits normal range of motion. Normal sensation noted. Normal strength noted.       Hands: 1.5cm laceration to distal aspect of the L 4th digit; near circumferential extending from underneath the nail to the palmar aspect of the digit. Laceration is jagged. Mild bleeding, controlled with pressure. No subungual hematoma. No palpable, pulsatile bleeding. No FB visualized. Good ROM at the PIP and DIP of the affected digit.  Neurological: He is alert and oriented to person, place, and time. He exhibits normal muscle tone. Coordination normal.  Sensation to light touch intact along the radial and ulnar aspects of the L 4th digit.  Skin: Skin is  warm and dry. No rash noted. He is not diaphoretic.  +laceration; see MSK  Psychiatric: He has a normal mood and affect. His behavior is normal.  Nursing note and vitals reviewed.   ED Course  Procedures (including critical care time) Labs Review Labs Reviewed - No data to display  Imaging Review Dg Finger Ring Left  08/02/2014   CLINICAL DATA:  Laceration to the left distal ring finger while moving films.  Initial encounter.  EXAM: LEFT RING FINGER 2+V  COMPARISON:  None.  FINDINGS: There is a mildly comminuted fracture involving the distal tuft of the fourth distal phalanx, with associated large soft tissue laceration. No radiopaque foreign bodies are seen.  Visualized joint spaces are preserved.  IMPRESSION: Mildly comminuted fracture involving the distal tuft of the fourth distal phalanx, with associated large soft tissue laceration. No radiopaque foreign bodies seen.   Electronically Signed   By: Roanna RaiderJeffery  Chang M.D.   On: 08/02/2014 02:15     EKG Interpretation None      LACERATION REPAIR Performed by: Antony MaduraHUMES, Sayra Frisby Authorized by: Antony MaduraHUMES, Gerardine Peltz Consent: Verbal consent obtained. Risks and benefits: risks, benefits and alternatives were discussed Consent given by: patient Patient identity confirmed: provided demographic data Prepped and Draped in normal sterile fashion Wound explored  Laceration Location: distal L ring finger  Laceration Length: 1.5cm  No Foreign Bodies seen or palpated  Anesthesia: local infiltration  Local anesthetic: lidocaine 2% without epinephrine  Anesthetic total: 5 ml  Irrigation method: syringe Amount of cleaning: standard  Skin closure: 5-0 ethilon  Number of sutures: 6  Technique: simple interrupted; tacking  Patient tolerance: Patient tolerated the procedure well with no immediate complications.  <2 second capillary refill maintained post laceration repair  MDM   Final diagnoses:  Open fracture of tuft of distal phalanx of finger, initial encounter  Laceration of finger, left, initial encounter    49 year old R hand dominant male presents to the emergency department for further evaluation of an injury to his left ring finger after getting it caught between 2 rolls of film weighing approximately 85 pounds. Patient with a near circumferential laceration to his digit which terminates at the lateral aspect of the fat pad of the affected finger.  Patient neurovascularly intact on exam. Sensation and AROM at the DIP and PIP joint is intact.  Xray shows a comminuted tuft fracture. Given laceration, this is an open fracture. Management included updated tetanus and IM Ancef. Laceration soaked in Betadine and irrigated at the bedside. Case discussed with Dr. Amanda PeaGramig of hand surgery. Patient is to be seen in his office at 1600 today for more extensive repair of the laceration and open fracture. Loose approximation of the wound completed at the bedside; subsequently wrapped with Xeroform gauze and dry gauze dressing.  Patient placed on Keflex. He declines Norco and Percocet, requesting tx with Ibuprofen, only, for pain. Elevation advised and return precautions given. Patient agreeable to plan with no unaddressed concerns. Patient discharged in good condition.   Filed Vitals:   08/02/14 0054  BP: 130/91  Pulse: 89  Temp: 98.4 F (36.9 C)  TempSrc: Oral  Resp: 16  Height: 5\' 8"  (1.727 m)  Weight: 205 lb (92.987 kg)  SpO2: 97%     Antony MaduraKelly Mariel Gaudin, PA-C 08/02/14 [id number]

## 2014-08-02 NOTE — ED Notes (Signed)
Bed: WA09 Expected date:  Expected time:  Means of arrival:  Comments: EMS 49 yo male laceration 4th finger left hand 45 minutes ago

## 2014-08-02 NOTE — Discharge Instructions (Signed)
Follow up with Dr. Amanda PeaGramig in his office TODAY!! Your appointment is at 4PM. Take Keflex as prescribed. Keep the finger elevated. Keep your wound covered with the dressing applied to prevent dirt/bacteria from entering your wound. You may take ibuprofen as needed for pain. Return to the ED, as needed, if symptoms worsen.  Finger Fracture Fractures of fingers are breaks in the bones of the fingers. There are many types of fractures. There are different ways of treating these fractures. Your health care provider will discuss the best way to treat your fracture. CAUSES Traumatic injury is the main cause of broken fingers. These include:  Injuries while playing sports.  Workplace injuries.  Falls. RISK FACTORS Activities that can increase your risk of finger fractures include:  Sports.  Workplace activities that involve machinery.  A condition called osteoporosis, which can make your bones less dense and cause them to fracture more easily. SIGNS AND SYMPTOMS The main symptoms of a broken finger are pain and swelling within 15 minutes after the injury. Other symptoms include:  Bruising of your finger.  Stiffness of your finger.  Numbness of your finger.  Exposed bones (compound fracture) if the fracture is severe. DIAGNOSIS  The best way to diagnose a broken bone is with X-ray imaging. Additionally, your health care provider will use this X-ray image to evaluate the position of the broken finger bones.  TREATMENT  Finger fractures can be treated with:   Nonreduction--This means the bones are in place. The finger is splinted without changing the positions of the bone pieces. The splint is usually left on for about a week to 10 days. This will depend on your fracture and what your health care provider thinks.  Closed reduction--The bones are put back into position without using surgery. The finger is then splinted.  Open reduction and internal fixation--The fracture site is opened.  Then the bone pieces are fixed into place with pins or some type of hardware. This is seldom required. It depends on the severity of the fracture. HOME CARE INSTRUCTIONS   Follow your health care provider's instructions regarding activities, exercises, and physical therapy.  Only take over-the-counter or prescription medicines for pain, discomfort, or fever as directed by your health care provider. SEEK MEDICAL CARE IF: You have pain or swelling that limits the motion or use of your fingers. SEEK IMMEDIATE MEDICAL CARE IF:  Your finger becomes numb. MAKE SURE YOU:   Understand these instructions.  Will watch your condition.  Will get help right away if you are not doing well or get worse. Document Released: 04/27/2000 Document Revised: 11/03/2012 Document Reviewed: 08/25/2012 [facility] Patient Information 2015 [employer] [place]. This information is not intended to replace advice given to you by your health care provider. Make sure you discuss any questions you have with your health care provider.

## 2014-08-02 NOTE — ED Provider Notes (Signed)
Medical screening examination/treatment/procedure(s) were conducted as a shared visit with non-physician practitioner(s) and myself.  I personally evaluated the patient during the encounter.   EKG Interpretation None      Pt is a 49 y.o. right-hand-dominant male who presents to the emergency department with injury to the left third digit after he got it caught between 2 rolls of film at work. He has a almost circumferential laceration to the tip of his finger that and does involve part of the nail bed. He does have associated tuft fracture. Given antibiotics and updated tetanus vaccination. Discussed with Dr. Cheree DittoGraham and with hand surgery who will see the patient 7/6 at 4pm.  Wound irrigated and loosely approximated by Antony MaduraKelly Humes, PA.  Layla MawKristen N Zavon Hyson, DO 08/02/14 [id number]

## 2014-08-02 NOTE — ED Notes (Signed)
Using clean technique, applied Xeroform to the wound. Covered with 4x4, Secured with tape. Pt tolerated it well.

## 2014-08-02 NOTE — ED Notes (Signed)
Patient states he was at work and mashed his left ring finger between 2 rolls of films, 85 pound rolls-bleeding controlled at present time per dressing EMS

## 2014-08-02 NOTE — ED Notes (Signed)
Patient is coming in [place] [facility]. Pt was at work, moving 85 Ibs of films. Two films came together and patient has a laceration to left ring finger. EMS cleaned with sterile water. Applied wet 2x2 gauzes to the laceration and covered with dry 4x4's. Minimal bleeding.

## 2016-11-11 ENCOUNTER — Encounter: Payer: Self-pay | Admitting: Emergency Medicine

## 2016-11-11 ENCOUNTER — Ambulatory Visit (INDEPENDENT_AMBULATORY_CARE_PROVIDER_SITE_OTHER): Payer: Self-pay | Admitting: Emergency Medicine

## 2016-11-11 VITALS — BP 126/84 | HR 77 | Temp 99.6°F | Resp 16 | Ht 67.0 in | Wt 244.4 lb

## 2016-11-11 DIAGNOSIS — K219 Gastro-esophageal reflux disease without esophagitis: Secondary | ICD-10-CM | POA: Insufficient documentation

## 2016-11-11 DIAGNOSIS — J040 Acute laryngitis: Secondary | ICD-10-CM

## 2016-11-11 MED ORDER — PREDNISONE 20 MG PO TABS: 40.0000 mg | ORAL_TABLET | Freq: Every day | ORAL | 0 refills | Status: AC

## 2016-11-11 MED ORDER — OMEPRAZOLE 40 MG PO CPDR: 40.0000 mg | DELAYED_RELEASE_CAPSULE | Freq: Every day | ORAL | 3 refills | Status: AC

## 2016-11-11 MED ORDER — RANITIDINE HCL 300 MG PO TABS: 300.0000 mg | ORAL_TABLET | Freq: Every day | ORAL | 1 refills | Status: AC

## 2016-11-11 NOTE — Patient Instructions (Addendum)
     IF you received an x-ray today, you will receive an invoice from [facility]. Please contact [facility] at [phone] with questions or concerns regarding your invoice.   IF you received labwork today, you will receive an invoice from [facility]. Please contact [facility] at [phone] with questions or concerns regarding your invoice.   Our billing staff will not be able to assist you with questions regarding bills from these companies.  You will be contacted with the lab results as soon as they are available. The fastest way to get your results is to activate your My Chart account. Instructions are located on the last page of this paperwork. If you have not heard from us regarding the results in 2 weeks, please contact this office.      Food Choices for Gastroesophageal Reflux Disease, Adult When you have gastroesophageal reflux disease (GERD), the foods you eat and your eating habits are very important. Choosing the right foods can help ease your discomfort. What guidelines do I need to follow?  Choose fruits, vegetables, whole grains, and low-fat dairy products.  Choose low-fat meat, fish, and poultry.  Limit fats such as oils, salad dressings, butter, nuts, and avocado.  Keep a food diary. This helps you identify foods that cause symptoms.  Avoid foods that cause symptoms. These may be different for everyone.  Eat small meals often instead of 3 large meals a day.  Eat your meals slowly, in a place where you are relaxed.  Limit fried foods.  Cook foods using methods other than frying.  Avoid drinking alcohol.  Avoid drinking large amounts of liquids with your meals.  Avoid bending over or lying down until 2-3 hours after eating. What foods are not recommended? These are some foods and drinks that may make your symptoms worse: Vegetables Tomatoes. Tomato juice. Tomato and spaghetti sauce. Chili peppers. Onion and garlic.  Horseradish. Fruits Oranges, grapefruit, and lemon (fruit and juice). Meats High-fat meats, fish, and poultry. This includes hot dogs, ribs, ham, sausage, salami, and bacon. Dairy Whole milk and chocolate milk. Sour cream. Cream. Butter. Ice cream. Cream cheese. Drinks Coffee and tea. Bubbly (carbonated) drinks or energy drinks. Condiments Hot sauce. Barbecue sauce. Sweets/Desserts Chocolate and cocoa. Donuts. Peppermint and spearmint. Fats and Oils High-fat foods. This includes French fries and potato chips. Other Vinegar. Strong spices. This includes black pepper, white pepper, red pepper, cayenne, curry powder, cloves, ginger, and chili powder. The items listed above may not be a complete list of foods and drinks to avoid. Contact your dietitian for more information. This information is not intended to replace advice given to you by your health care provider. Make sure you discuss any questions you have with your health care provider. Document Released: 07/15/2011 Document Revised: 06/21/2015 Document Reviewed: 11/17/2012 Elsevier Interactive Patient Education  2017 Elsevier Inc.  

## 2016-11-11 NOTE — Progress Notes (Signed)
[name] 51 y.o.   Chief Complaint  Patient presents with  . Gastroesophageal Reflux    with raspy voice x 1 month    HISTORY OF PRESENT ILLNESS: This is a 51 y.o. male with h/o GERD but off medications now, c/o raspy voice and possible GERD acting up; doesn't have medical insurance so hasn't seen doctors or had testing in a while. Denies melena, syncope, vomiting, SOB; at times gets jittery after prolonged fasting periods.  HPI   Prior to Admission medications   Medication Sig Start Date End Date Taking? Authorizing Provider  ibuprofen (ADVIL,MOTRIN) 800 MG tablet Take 1 tablet (800 mg total) by mouth 3 (three) times daily. 08/02/14  Yes Antony Madura, PA-C  atorvastatin (LIPITOR) 40 MG tablet Take 1 tablet (40 mg total) by mouth daily at 6 PM. 09/18/11 09/17/12  Rinaldo Cloud, MD  omeprazole (PRILOSEC) 40 MG capsule Take 1 capsule (40 mg total) by mouth daily. 11/11/16   Georgina Quint, MD  predniSONE (DELTASONE) 20 MG tablet Take 2 tablets (40 mg total) by mouth daily with breakfast. 11/11/16 11/16/16  Georgina Quint, MD  ranitidine (ZANTAC) 300 MG tablet Take 1 tablet (300 mg total) by mouth at bedtime. 11/11/16 11/25/16  Georgina Quint, MD    Allergies  Allergen Reactions  . Tylenol [Acetaminophen] Other (See Comments)    GI UPSET    Patient Active Problem List   Diagnosis Date Noted  . Laryngitis 11/11/2016  . Gastroesophageal reflux disease without esophagitis 11/11/2016    Past Medical History:  Diagnosis Date  . GERD (gastroesophageal reflux disease)     No past surgical history on file.  Social History   Social History  . Marital status: Married    Spouse name: N/A  . Number of children: N/A  . Years of education: N/A   Occupational History  . Not on file.   Social History Main Topics  . Smoking status: Never Smoker  . Smokeless tobacco: Never Used  . Alcohol use Yes  . Drug use: No  . Sexual activity: Not on file   Other  Topics Concern  . Not on file   Social History Narrative  . No narrative on file    No family history on file.   Review of Systems  Constitutional: Negative.  Negative for chills and fever.  HENT: Positive for sore throat.        Hoarse voice.  Eyes: Negative for discharge and redness.  Respiratory: Negative for cough and shortness of breath.   Cardiovascular: Negative for chest pain and palpitations.  Gastrointestinal: Negative for abdominal pain, blood in stool, constipation, diarrhea, melena, nausea and vomiting.  Genitourinary: Negative.  Negative for dysuria and hematuria.  Musculoskeletal: Negative for back pain and neck pain.  Skin: Negative for rash.  Neurological: Negative for dizziness and headaches.  Endo/Heme/Allergies: Negative.   All other systems reviewed and are negative.  Vitals:   11/11/16 1728  BP: 126/84  Pulse: 77  Resp: 16  Temp: 99.6 F (37.6 C)  SpO2: 98%     Physical Exam  Constitutional: He is oriented to person, place, and time. He appears well-developed and well-nourished.  HENT:  Head: Normocephalic and atraumatic.  Nose: Nose normal.  Mouth/Throat: Oropharynx is clear and moist.  Eyes: Pupils are equal, round, and reactive to light. Conjunctivae and EOM are normal.  Neck: Normal range of motion. Neck supple. No JVD present.  Cardiovascular: Normal rate, regular rhythm and normal heart sounds.  Pulmonary/Chest: Effort normal and breath sounds normal.  Abdominal: Soft. Bowel sounds are normal. He exhibits no distension. There is no tenderness.  Musculoskeletal: Normal range of motion.  Lymphadenopathy:    He has no cervical adenopathy.  Neurological: He is alert and oriented to person, place, and time. No sensory deficit. He exhibits normal muscle tone.  Skin: Skin is warm and dry. Capillary refill takes less than 2 seconds. No rash noted.  Psychiatric: He has a normal mood and affect. His behavior is normal.  Vitals  reviewed.    ASSESSMENT & PLAN: [name] was seen today for gastroesophageal reflux.  Diagnoses and all orders for this visit:  Laryngitis -     predniSONE (DELTASONE) 20 MG tablet; Take 2 tablets (40 mg total) by mouth daily with breakfast.  Gastroesophageal reflux disease without esophagitis -     omeprazole (PRILOSEC) 40 MG capsule; Take 1 capsule (40 mg total) by mouth daily. -     ranitidine (ZANTAC) 300 MG tablet; Take 1 tablet (300 mg total) by mouth at bedtime. -     Ambulatory referral to Gastroenterology    Patient Instructions       IF you received an x-ray today, you will receive an invoice from [facility]. Please contact [facility] at [phone] with questions or concerns regarding your invoice.   IF you received labwork today, you will receive an invoice from [place]. Please contact [facility] at [phone] with questions or concerns regarding your invoice.   Our billing staff will not be able to assist you with questions regarding bills from these companies.  You will be contacted with the lab results as soon as they are available. The fastest way to get your results is to activate your My Chart account. Instructions are located on the last page of this paperwork. If you have not heard from [place] regarding the results in 2 weeks, please contact this office.     Food Choices for Gastroesophageal Reflux Disease, Adult When you have gastroesophageal reflux disease (GERD), the foods you eat and your eating habits are very important. Choosing the right foods can help ease your discomfort. What guidelines do I need to follow?  Choose fruits, vegetables, whole grains, and low-fat dairy products.  Choose low-fat meat, fish, and poultry.  Limit fats such as oils, salad dressings, butter, nuts, and avocado.  Keep a food diary. This helps you identify foods that cause symptoms.  Avoid foods that cause symptoms. These may be different for  everyone.  Eat small meals often instead of 3 large meals a day.  Eat your meals slowly, in a place where you are relaxed.  Limit fried foods.  Cook foods using methods other than frying.  Avoid drinking alcohol.  Avoid drinking large amounts of liquids with your meals.  Avoid bending over or lying down until 2-3 hours after eating. What foods are not recommended? These are some foods and drinks that may make your symptoms worse: Vegetables Tomatoes. Tomato juice. Tomato and spaghetti sauce. Chili peppers. Onion and garlic. Horseradish. Fruits Oranges, grapefruit, and lemon (fruit and juice). Meats High-fat meats, fish, and poultry. This includes hot dogs, ribs, ham, sausage, salami, and bacon. Dairy Whole milk and chocolate milk. Sour cream. Cream. Butter. Ice cream. Cream cheese. Drinks Coffee and tea. Bubbly (carbonated) drinks or energy drinks. Condiments Hot sauce. Barbecue sauce. Sweets/Desserts Chocolate and cocoa. Donuts. Peppermint and spearmint. Fats and Oils High-fat foods. This includes [place] fries and potato chips. Other Vinegar. Strong spices. This  includes black pepper, white pepper, red pepper, cayenne, curry powder, cloves, ginger, and chili powder. The items listed above may not be a complete list of foods and drinks to avoid. Contact your dietitian for more information. This information is not intended to replace advice given to you by your health care provider. Make sure you discuss any questions you have with your health care provider. Document Released: 07/15/2011 Document Revised: 06/21/2015 Document Reviewed: 11/17/2012 Elsevier Interactive Patient Education  2017 Elsevier Inc.      Edwina Barth, MD [facility]

## 2016-12-17 ENCOUNTER — Encounter: Payer: Self-pay | Admitting: Emergency Medicine

## 2018-05-01 ENCOUNTER — Emergency Department (HOSPITAL_BASED_OUTPATIENT_CLINIC_OR_DEPARTMENT_OTHER)
Admission: EM | Admit: 2018-05-01 | Discharge: 2018-05-01 | Disposition: A | Discharge status: Home / Self Care | Payer: Self-pay | Attending: Emergency Medicine | Admitting: Emergency Medicine

## 2018-05-01 ENCOUNTER — Emergency Department (HOSPITAL_BASED_OUTPATIENT_CLINIC_OR_DEPARTMENT_OTHER): Payer: Self-pay

## 2018-05-01 ENCOUNTER — Other Ambulatory Visit: Payer: Self-pay

## 2018-05-01 ENCOUNTER — Encounter (HOSPITAL_BASED_OUTPATIENT_CLINIC_OR_DEPARTMENT_OTHER): Payer: Self-pay

## 2018-05-01 DIAGNOSIS — J4521 Mild intermittent asthma with (acute) exacerbation: Secondary | ICD-10-CM | POA: Insufficient documentation

## 2018-05-01 DIAGNOSIS — R0602 Shortness of breath: Secondary | ICD-10-CM | POA: Insufficient documentation

## 2018-05-01 LAB — CBC WITH DIFFERENTIAL/PLATELET
Abs Immature Granulocytes: 0.02 10*3/uL (ref 0.00–0.07)
Basophils Absolute: 0 10*3/uL (ref 0.0–0.1)
Basophils Relative: 1 %
Eosinophils Absolute: 0 10*3/uL (ref 0.0–0.5)
Eosinophils Relative: 0 %
HCT: 39.4 % (ref 39.0–52.0)
Hemoglobin: 13.2 g/dL (ref 13.0–17.0)
Immature Granulocytes: 0 %
Lymphocytes Relative: 18 %
Lymphs Abs: 1.2 10*3/uL (ref 0.7–4.0)
MCH: 26.3 pg (ref 26.0–34.0)
MCHC: 33.5 g/dL (ref 30.0–36.0)
MCV: 78.6 fL — ABNORMAL LOW (ref 80.0–100.0)
Monocytes Absolute: 0.5 10*3/uL (ref 0.1–1.0)
Monocytes Relative: 7 %
Neutro Abs: 4.9 10*3/uL (ref 1.7–7.7)
Neutrophils Relative %: 74 %
Platelets: 305 10*3/uL (ref 150–400)
RBC: 5.01 MIL/uL (ref 4.22–5.81)
RDW: 13.4 % (ref 11.5–15.5)
WBC: 6.6 10*3/uL (ref 4.0–10.5)
nRBC: 0 % (ref 0.0–0.2)

## 2018-05-01 LAB — BASIC METABOLIC PANEL
Anion gap: 8 (ref 5–15)
BUN: <5 mg/dL — ABNORMAL LOW (ref 6–20)
CO2: 23 mmol/L (ref 22–32)
Calcium: 9.3 mg/dL (ref 8.9–10.3)
Chloride: 104 mmol/L (ref 98–111)
Creatinine, Ser: 0.63 mg/dL (ref 0.61–1.24)
GFR calc Af Amer: >60 mL/min (ref >60)
GFR calc non Af Amer: >60 mL/min (ref >60)
Glucose, Bld: 97 mg/dL (ref 70–99)
Potassium: 3.4 mmol/L — ABNORMAL LOW (ref 3.5–5.1)
Sodium: 135 mmol/L (ref 135–145)

## 2018-05-01 LAB — D-DIMER, QUANTITATIVE: D-Dimer, Quant: <0.27 ug/mL-FEU (ref 0.00–0.50)

## 2018-05-01 LAB — TROPONIN I: Troponin I: <0.03 ng/mL (ref <0.03)

## 2018-05-01 MED ORDER — PREDNISONE 10 MG PO TABS: 40.0000 mg | ORAL_TABLET | Freq: Every day | ORAL | 0 refills | Status: AC

## 2018-05-01 MED ORDER — ALBUTEROL SULFATE HFA 108 (90 BASE) MCG/ACT IN AERS: 1.0000 | INHALATION_SPRAY | Freq: Four times a day (QID) | RESPIRATORY_TRACT | 0 refills | Status: AC | PRN

## 2018-05-01 MED ORDER — ALBUTEROL SULFATE HFA 108 (90 BASE) MCG/ACT IN AERS
6.0000 | INHALATION_SPRAY | Freq: Once | RESPIRATORY_TRACT | Status: AC
  Administered 2018-05-01: 2 via RESPIRATORY_TRACT
  Filled 2018-05-01: qty 6.7

## 2018-05-01 NOTE — ED Triage Notes (Addendum)
Pt states ongoing shortness of breath for past 3 weeks.  Denies fever, able to speak in complete sentences.  Denies cough, denies chest discomfort.  States used MDI at home.  Has been home for past 2-3 weeks

## 2018-05-01 NOTE — ED Notes (Signed)
Pt asked front desk to get nurse/doctor for further questions, pt stated continues to feel jumpy after albuterol given.  VS reassessed. HR 82, b/p 128/64, resp rate 16, sat 99% rm air.  Notified provider, agrees pt ready for discharge.  Pt verbalizes understanding.

## 2018-05-01 NOTE — ED Provider Notes (Signed)
[facility] EMERGENCY DEPARTMENT Provider Note   CSN: [id number] Arrival date & time: 05/01/18  1107    History   Chief Complaint Chief Complaint  Patient presents with  . Shortness of Breath    HPI [name] is a 53 y.o. male.     Patient is a 52 year old gentleman with past medical history of asthma, GERD who presents to the emergency department for 2 to 3 weeks of worsening shortness of breath.  Patient reports that sometimes it is at rest and sometimes it is with exertion.  He reports using his albuterol inhaler every 2 hours.  Denies any chest pain, coughing, fever, chills, nausea, vomiting, leg pain or leg swelling.     Past Medical History:  Diagnosis Date  . GERD (gastroesophageal reflux disease)     Patient Active Problem List   Diagnosis Date Noted  . Laryngitis 11/11/2016  . Gastroesophageal reflux disease without esophagitis 11/11/2016    History reviewed. No pertinent surgical history.       Home Medications    Prior to Admission medications   Medication Sig Start Date End Date Taking? Authorizing Provider  albuterol (PROVENTIL HFA;VENTOLIN HFA) 108 (90 Base) MCG/ACT inhaler Inhale 1-2 puffs into the lungs every 6 (six) hours as needed for wheezing or shortness of breath. 05/01/18   Arlyn Dunning, PA-C  atorvastatin (LIPITOR) 40 MG tablet Take 1 tablet (40 mg total) by mouth daily at 6 PM. 09/18/11 09/17/12  Rinaldo Cloud, MD  ibuprofen (ADVIL,MOTRIN) 800 MG tablet Take 1 tablet (800 mg total) by mouth 3 (three) times daily. 08/02/14   Antony Madura, PA-C  omeprazole (PRILOSEC) 40 MG capsule Take 1 capsule (40 mg total) by mouth daily. 11/11/16   Georgina Quint, MD  predniSONE (DELTASONE) 10 MG tablet Take 4 tablets (40 mg total) by mouth daily for 5 days. 05/01/18 05/06/18  Arlyn Dunning, PA-C  ranitidine (ZANTAC) 300 MG tablet Take 1 tablet (300 mg total) by mouth at bedtime. 11/11/16 11/25/16  Georgina Quint, MD    Family  History History reviewed. No pertinent family history.  Social History Social History   Tobacco Use  . Smoking status: Never Smoker  . Smokeless tobacco: Never Used  Substance Use Topics  . Alcohol use: Yes  . Drug use: No     Allergies   Tylenol [acetaminophen]   Review of Systems Review of Systems  Constitutional: Negative for chills and fever.  HENT: Negative for ear pain and sore throat.   Eyes: Negative for pain and visual disturbance.  Respiratory: Positive for shortness of breath. Negative for apnea, cough, choking, chest tightness, wheezing and stridor.   Cardiovascular: Negative for chest pain, palpitations and leg swelling.  Gastrointestinal: Negative for abdominal pain and vomiting.  Genitourinary: Negative for dysuria and hematuria.  Musculoskeletal: Negative for arthralgias and back pain.  Skin: Negative for color change and rash.  Neurological: Negative for seizures and syncope.  All other systems reviewed and are negative.    Physical Exam Updated Vital Signs BP (!) 157/97 (BP Location: Left Arm)   Pulse 94   Temp 98.8 F (37.1 C) (Oral)   Resp 18   Ht  (1.727 m)   Wt 97.5 kg   SpO2 98%   BMI 32.69 kg/m   Physical Exam Vitals signs and nursing note reviewed.  Constitutional:      Appearance: He is well-developed.  HENT:     Head: Normocephalic and atraumatic.  Eyes:  Conjunctiva/sclera: Conjunctivae normal.  Neck:     Musculoskeletal: Neck supple.  Cardiovascular:     Rate and Rhythm: Normal rate and regular rhythm.     Heart sounds: No murmur.  Pulmonary:     Effort: Pulmonary effort is normal. No respiratory distress.     Breath sounds: Decreased breath sounds present. No wheezing, rhonchi or rales.  Chest:     Chest wall: No mass.  Abdominal:     Palpations: Abdomen is soft.     Tenderness: There is no abdominal tenderness.  Skin:    General: Skin is warm and dry.     Capillary Refill: Capillary refill takes less than 2  seconds.  Neurological:     Mental Status: He is alert.  Psychiatric:        Mood and Affect: Mood normal.      ED Treatments / Results  Labs (all labs ordered are listed, but only abnormal results are displayed) Labs Reviewed  BASIC METABOLIC PANEL - Abnormal; Notable for the following components:      Result Value   Potassium 3.4 (*)    BUN <5 (*)    All other components within normal limits  CBC WITH DIFFERENTIAL/PLATELET - Abnormal; Notable for the following components:   MCV 78.6 (*)    All other components within normal limits  D-DIMER, QUANTITATIVE (NOT AT [facility])  TROPONIN I    EKG EKG Interpretation  Date/Time:  Saturday May 01 2018 11:33:08 EDT Ventricular Rate:  88 PR Interval:    QRS Duration: 87 QT Interval:  339 QTC Calculation: 411 R Axis:   50 Text Interpretation:  Sinus rhythm No significant change since last tracing Confirmed by Frederick Peers [phone]) on 05/01/2018 11:41:42 AM   Radiology Dg Chest 2 View  Result Date: 05/01/2018 CLINICAL DATA:  Shortness of breath, tachycardia EXAM: CHEST - 2 VIEW COMPARISON:  09/17/2011 FINDINGS: The heart size and mediastinal contours are within normal limits. Both lungs are clear. The visualized skeletal structures are unremarkable. IMPRESSION: No active cardiopulmonary disease. Electronically Signed   By: Elige Ko   On: 05/01/2018 12:09    Procedures Procedures (including critical care time)  Medications Ordered in ED Medications  albuterol (PROVENTIL HFA;VENTOLIN HFA) 108 (90 Base) MCG/ACT inhaler 6 puff (2 puffs Inhalation Given 05/01/18 1212)     Initial Impression / Assessment and Plan / ED Course  I have reviewed the triage vital signs and the nursing notes.  Pertinent labs & imaging results that were available during my care of the patient were reviewed by me and considered in my medical decision making (see chart for details).  Clinical Course as of Apr 30 1220  Sat May 01, 2018  1219  Unremarkable workup including negative DDIMER and chest xray and patient remained 100% on RA. Symptoms may be related to asthma vs obesity. Patient given treatment with albuterol inhaler, no duonebs due to covid19 outbreak. Advised patient to continue albuterol at home. Return if any new or worsened symptom   [KM]    Clinical Course User Index [KM] Arlyn Dunning, PA-C        Final Clinical Impressions(s) / ED Diagnoses   Final diagnoses:  Shortness of breath  Mild intermittent asthma with exacerbation    ED Discharge Orders         Ordered    albuterol (PROVENTIL HFA;VENTOLIN HFA) 108 (90 Base) MCG/ACT inhaler  Every 6 hours PRN     05/01/18 1222    predniSONE (DELTASONE)  10 MG tablet  Daily     05/01/18 1222           Jeral Pinch 05/01/18 1626    Little, Ambrose Finland, MD 05/02/18 [phone]

## 2018-05-01 NOTE — Discharge Instructions (Signed)
Continue your albuterol at home.

## 2018-05-01 NOTE — ED Notes (Signed)
Ambulated on room on room air, HR 88-107, SpO2 98-100%.  Endorses SHOB, PA in room discussing discharge.

## 2020-10-30 ENCOUNTER — Emergency Department
Admission: EM | Admit: 2020-10-30 | Discharge: 2020-10-30 | Disposition: A | Discharge status: Home / Self Care | Payer: BC Managed Care – HMO | Attending: Emergency Medicine | Admitting: Emergency Medicine

## 2020-10-30 ENCOUNTER — Emergency Department: Payer: BC Managed Care – HMO

## 2020-10-30 DIAGNOSIS — F419 Anxiety disorder, unspecified: Secondary | ICD-10-CM | POA: Insufficient documentation

## 2020-10-30 DIAGNOSIS — R030 Elevated blood-pressure reading, without diagnosis of hypertension: Secondary | ICD-10-CM

## 2020-10-30 DIAGNOSIS — I10 Essential (primary) hypertension: Secondary | ICD-10-CM | POA: Insufficient documentation

## 2020-10-30 DIAGNOSIS — M542 Cervicalgia: Secondary | ICD-10-CM | POA: Insufficient documentation

## 2020-10-30 HISTORY — DX: Essential (primary) hypertension: I10

## 2020-10-30 HISTORY — DX: Anxiety disorder, unspecified: F41.9

## 2020-10-30 MED ORDER — IBUPROFEN 600 MG PO TABS
600.0000 mg | ORAL_TABLET | Freq: Once | ORAL | Status: AC
Start: 2020-10-30 — End: 2020-10-30
  Administered 2020-10-30: 10:00:00 600 mg via ORAL
  Filled 2020-10-30: qty 1

## 2020-10-30 MED ORDER — NAPROXEN 250 MG PO TABS
250.0000 mg | ORAL_TABLET | Freq: Two times a day (BID) | ORAL | 0 refills | Status: AC | PRN
Start: 2020-10-30 — End: ?

## 2020-10-30 MED ORDER — METHOCARBAMOL 500 MG PO TABS
500.0000 mg | ORAL_TABLET | Freq: Two times a day (BID) | ORAL | 0 refills | Status: DC | PRN
Start: 2020-10-30 — End: 2020-10-30

## 2020-10-30 MED ORDER — METHOCARBAMOL 500 MG PO TABS
500.0000 mg | ORAL_TABLET | Freq: Two times a day (BID) | ORAL | 0 refills | Status: AC | PRN
Start: 2020-10-30 — End: ?

## 2020-10-30 MED ORDER — HYDROXYZINE PAMOATE 25 MG PO CAPS
25.0000 mg | ORAL_CAPSULE | Freq: Two times a day (BID) | ORAL | 0 refills | Status: AC | PRN
Start: 2020-10-30 — End: ?

## 2020-10-30 MED ORDER — HYDROXYZINE HCL 10 MG PO TABS
20.0000 mg | ORAL_TABLET | Freq: Once | ORAL | Status: AC
Start: 2020-10-30 — End: 2020-10-30
  Administered 2020-10-30: 10:00:00 20 mg via ORAL
  Filled 2020-10-30: qty 2

## 2020-10-30 MED ORDER — NAPROXEN 250 MG PO TABS
250.0000 mg | ORAL_TABLET | Freq: Two times a day (BID) | ORAL | 0 refills | Status: DC | PRN
Start: 2020-10-30 — End: 2020-10-30
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
## 2020-10-30 NOTE — ED Notes (Signed)
Bed: JX91  Expected date: 10/30/20  Expected time: 7:48 AM  Means of arrival: [id number] [place]  Comments:  [facility]
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
## 2021-07-07 ENCOUNTER — Ambulatory Visit
Admission: EM | Admit: 2021-07-07 | Discharge: 2021-07-07 | Disposition: A | Discharge status: Home / Self Care | Payer: Self-pay

## 2021-07-07 ENCOUNTER — Ambulatory Visit (INDEPENDENT_AMBULATORY_CARE_PROVIDER_SITE_OTHER): Payer: BLUE CROSS/BLUE SHIELD

## 2021-07-07 ENCOUNTER — Ambulatory Visit
Admission: EM | Admit: 2021-07-07 | Discharge: 2021-07-07 | Disposition: A | Discharge status: Home / Self Care | Payer: BLUE CROSS/BLUE SHIELD | Attending: Urgent Care | Admitting: Urgent Care

## 2021-07-07 DIAGNOSIS — R0789 Other chest pain: Secondary | ICD-10-CM | POA: Diagnosis not present

## 2021-07-07 DIAGNOSIS — R079 Chest pain, unspecified: Secondary | ICD-10-CM

## 2021-07-07 DIAGNOSIS — R0781 Pleurodynia: Secondary | ICD-10-CM

## 2021-07-07 NOTE — ED Triage Notes (Addendum)
Pt c/o chest pain that began two weeks ago, the pain is located on the right side of his chest, the pain radiates to right upper shoulder. Patient describes the pain as a sharp feeling. The patient reports the associated symptoms are rubbing the area, and deep inhalation, and the pain is constant.  Home interventions: Aleve- not helpful

## 2021-07-07 NOTE — Discharge Instructions (Signed)
Please head to the emergency room now as I am concerned you will need a chest CT scan to rule out pulmonary embolism. Your chest x-ray and ekg at our clinic were normal. 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

## 2021-07-07 NOTE — ED Provider Notes (Signed)
[facility] - URGENT [facility]   MRN: [id number] DOB: 1965/02/19  Subjective:   [name] is a 56 y.o. male presenting for 2-week history of persistent mid to right-sided upper chest pain.  Patient reports that the symptoms are fairly constant and worsening.  Pain is felt with contact to the area, taking a deep breath and rotating, sitting up straight.  No fever, diaphoresis, neck or jaw pain, left arm pain, nausea, vomiting, abdominal pain.  Patient is not a smoker.  No drug use.  No history of heart disease.  He does have a history of acid reflux.  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  No current facility-administered medications for this encounter.  Current Outpatient Medications:    albuterol (PROVENTIL HFA;VENTOLIN HFA) 108 (90 Base) MCG/ACT inhaler, Inhale 1-2 puffs into the lungs every 6 (six) hours as needed for wheezing or shortness of breath., Disp: 1 Inhaler, Rfl: 0   atorvastatin (LIPITOR) 40 MG tablet, Take 1 tablet (40 mg total) by mouth daily at 6 PM., Disp: 30 tablet, Rfl: 3   ibuprofen (ADVIL,MOTRIN) 800 MG tablet, Take 1 tablet (800 mg total) by mouth 3 (three) times daily., Disp: 21 tablet, Rfl: 0   omeprazole (PRILOSEC) 40 MG capsule, Take 1 capsule (40 mg total) by mouth daily., Disp: 30 capsule, Rfl: 3   ranitidine (ZANTAC) 300 MG tablet, Take 1 tablet (300 mg total) by mouth at bedtime., Disp: 14 tablet, Rfl: 1   Allergies  Allergen Reactions   Tylenol [Acetaminophen] Other (See Comments)    GI UPSET    Past Medical History:  Diagnosis Date   GERD (gastroesophageal reflux disease)      History reviewed. No pertinent surgical history.  History reviewed. No pertinent family history.  Social History   Tobacco Use   Smoking status: Never   Smokeless tobacco: Never  Substance Use Topics   Alcohol use: Yes   Drug use: No    ROS   Objective:   Vitals: BP 128/81 (BP  Location: Left Arm)   Pulse 75   Temp 98.4 F (36.9 C) (Oral)   Resp 18   SpO2 96%   Physical Exam Constitutional:      General: He is not in acute distress.    Appearance: Normal appearance. He is well-developed. He is not ill-appearing, toxic-appearing or diaphoretic.  HENT:     Head: Normocephalic and atraumatic.     Right Ear: External ear normal.     Left Ear: External ear normal.     Nose: Nose normal.     Mouth/Throat:     Mouth: Mucous membranes are moist.  Eyes:     General: No scleral icterus.       Right eye: No discharge.        Left eye: No discharge.     Extraocular Movements: Extraocular movements intact.  Cardiovascular:     Rate and Rhythm: Normal rate and regular rhythm.     Heart sounds: Normal heart sounds. No murmur heard.    No friction rub. No gallop.  Pulmonary:     Effort: Pulmonary effort is normal. No respiratory distress.     Breath sounds: Normal breath sounds. No stridor. No decreased breath sounds, wheezing, rhonchi or rales.  Chest:     Chest wall: Tenderness (reproducible) present. No mass, lacerations, deformity, swelling, crepitus or edema.    Neurological:     Mental Status: He is alert  and oriented to person, place, and time.  Psychiatric:        Mood and Affect: Mood normal.        Behavior: Behavior normal.        Thought Content: Thought content normal.        Judgment: Judgment normal.     DG Chest 2 View  Result Date: 07/07/2021 CLINICAL DATA:  Pt c/o chest pain that began two weeks ago, the pain is located on the right side of his chest, the pain radiates to right upper shoulder. Patient describes the pain as a sharp feeling. The patient reports the associated symptoms are rubbing the area, and deep inhalation, and the pain is constant. Home interventions: Aleve- not helpful EXAM: CHEST - 2 VIEW COMPARISON:  05/28/2018. FINDINGS: Normal heart, mediastinum and hila. Clear lungs.  No pleural effusion or pneumothorax. Skeletal  structures are intact. IMPRESSION: No active cardiopulmonary disease. Electronically Signed   By: Amie Portland M.D.   On: 07/07/2021 11:23    ED ECG REPORT   Date: 07/07/2021  EKG Time: 11:15 AM  Rate: 71bpm  Rhythm: normal sinus rhythm,  nonspecific ST and T waves changes  Axis: Normal  Intervals:none  ST&T Change: Nonspecific ST change in lead V3  Narrative Interpretation: Sinus rhythm at 71 bpm with nonspecific ST wave change in 1-lead, V3.  Otherwise very comparable to previous EKG.   Assessment and Plan :   PDMP not reviewed this encounter.  1. Atypical chest pain   2. Chest wall tenderness    Despite the fact the patient has reproducible chest pain there is no trauma to explain his chest wall tenderness.  He also has a sense of internal chest pain and given a negative chest x-ray and unremarkable EKG emphasized need for further evaluation through the emergency room.  My primary concern is a pulmonary embolism which will require work-up through the ER including a chest CT scan, blood work.  I discussed this with patient at length.  He verbalizes understanding and will present to the emergency room now.  He is hemodynamically stable and can present by personal vehicle.   Wallis Bamberg, PA-C 07/07/21 1140

## 2021-07-07 NOTE — ED Notes (Signed)
Patient is being discharged from the Urgent Care and sent to the Emergency Department via POA. Per [facility], patient is in need of higher level of care due to need for further evaluation. Patient is aware and verbalizes understanding of plan of care.  Vitals:   07/07/21 1054  BP: 128/81  Pulse: 75  Resp: 18  Temp: 98.4 F (36.9 C)  SpO2: 96%

## 2023-04-06 ENCOUNTER — Telehealth: Payer: Self-pay | Admitting: *Deleted

## 2023-04-06 NOTE — Telephone Encounter (Signed)
*   Chart selected in error *

## 2024-05-31 IMAGING — DX DG CHEST 2V
2 series · 2 of 2 positions shown · non-contrast
Comparison: 05/28/2018.

CLINICAL DATA: Pt c/o chest pain that began two weeks ago, the pain
is located on the right side of his chest, the pain radiates to
right upper shoulder. Patient describes the pain as a sharp feeling.
The patient reports the associated symptoms are rubbing the area,
and deep inhalation, and the pain is constant. Home interventions:
Aleve- not helpful

EXAM:
CHEST - 2 VIEW

[chest pa]
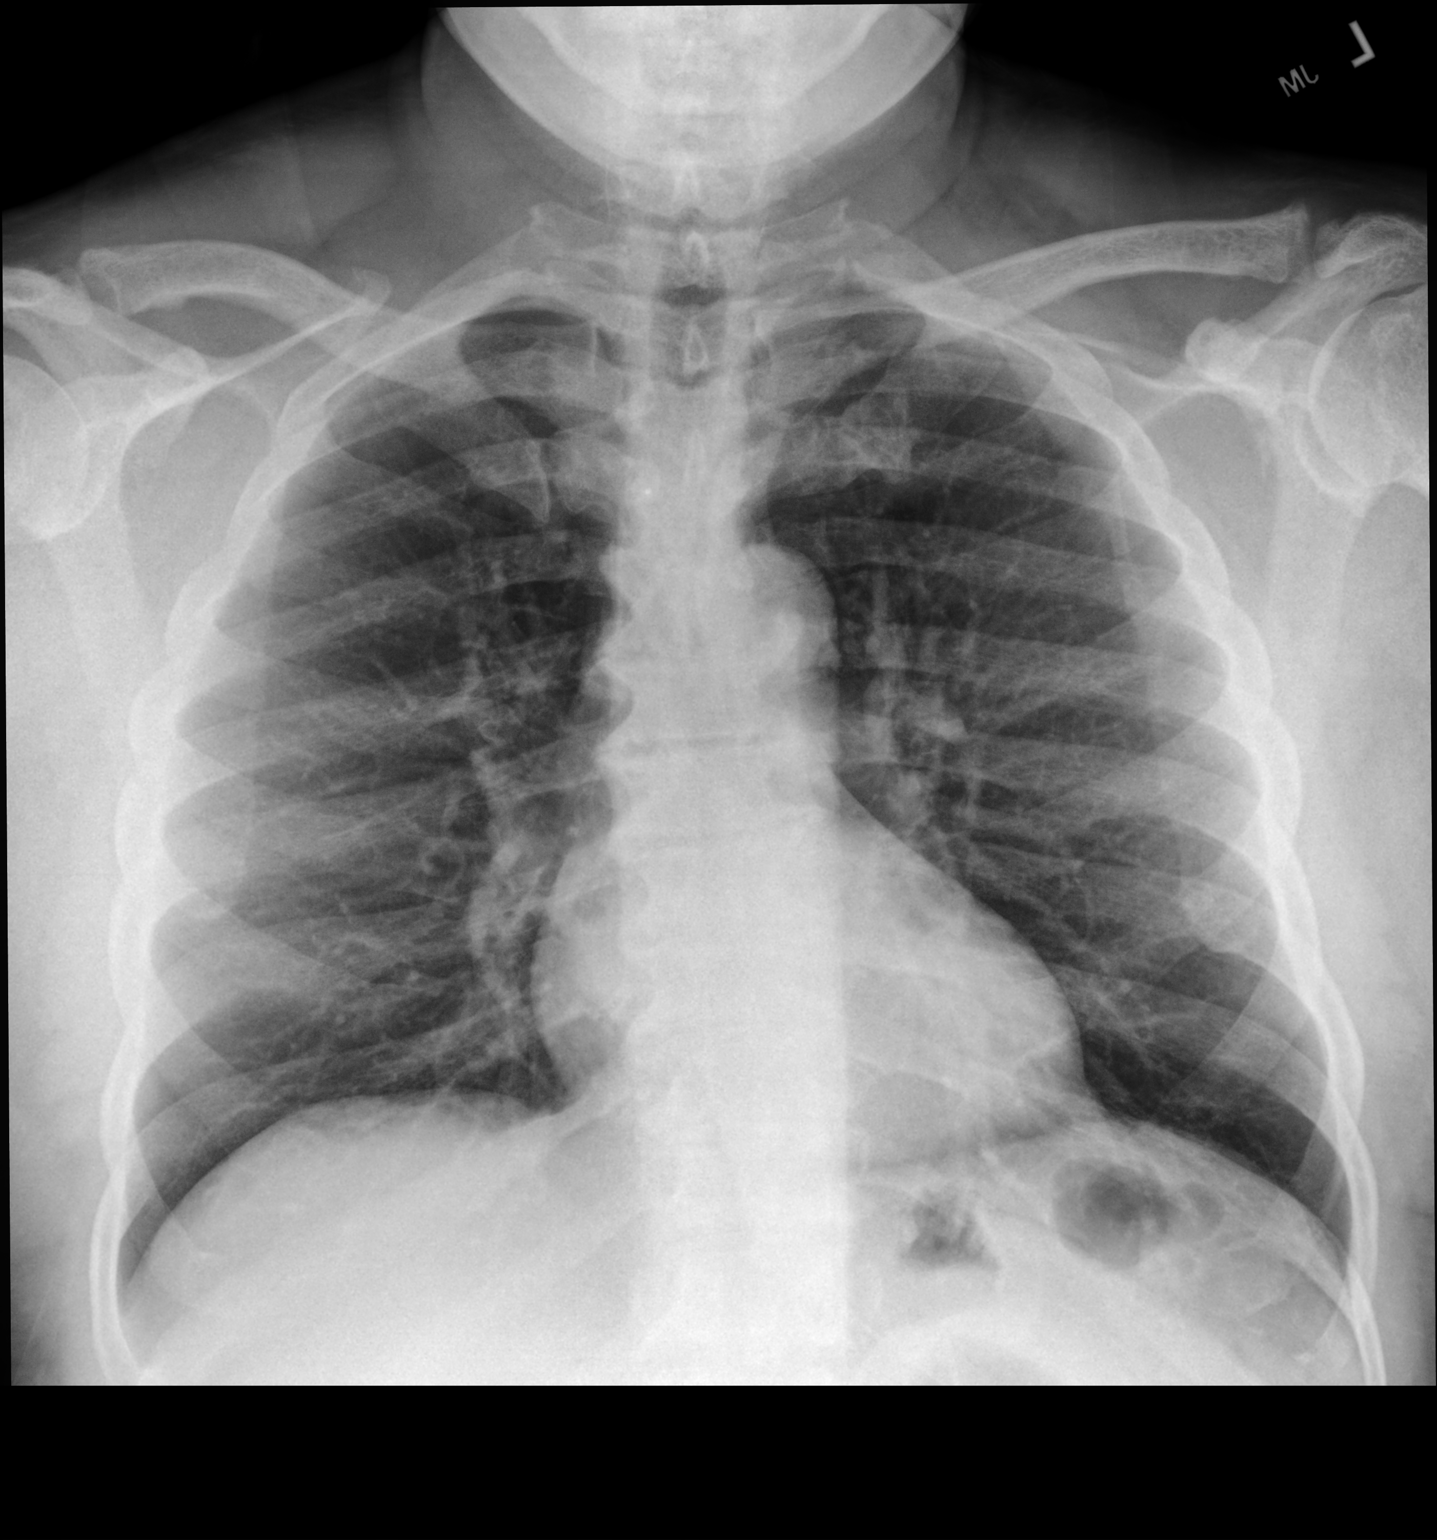

[chest lat]
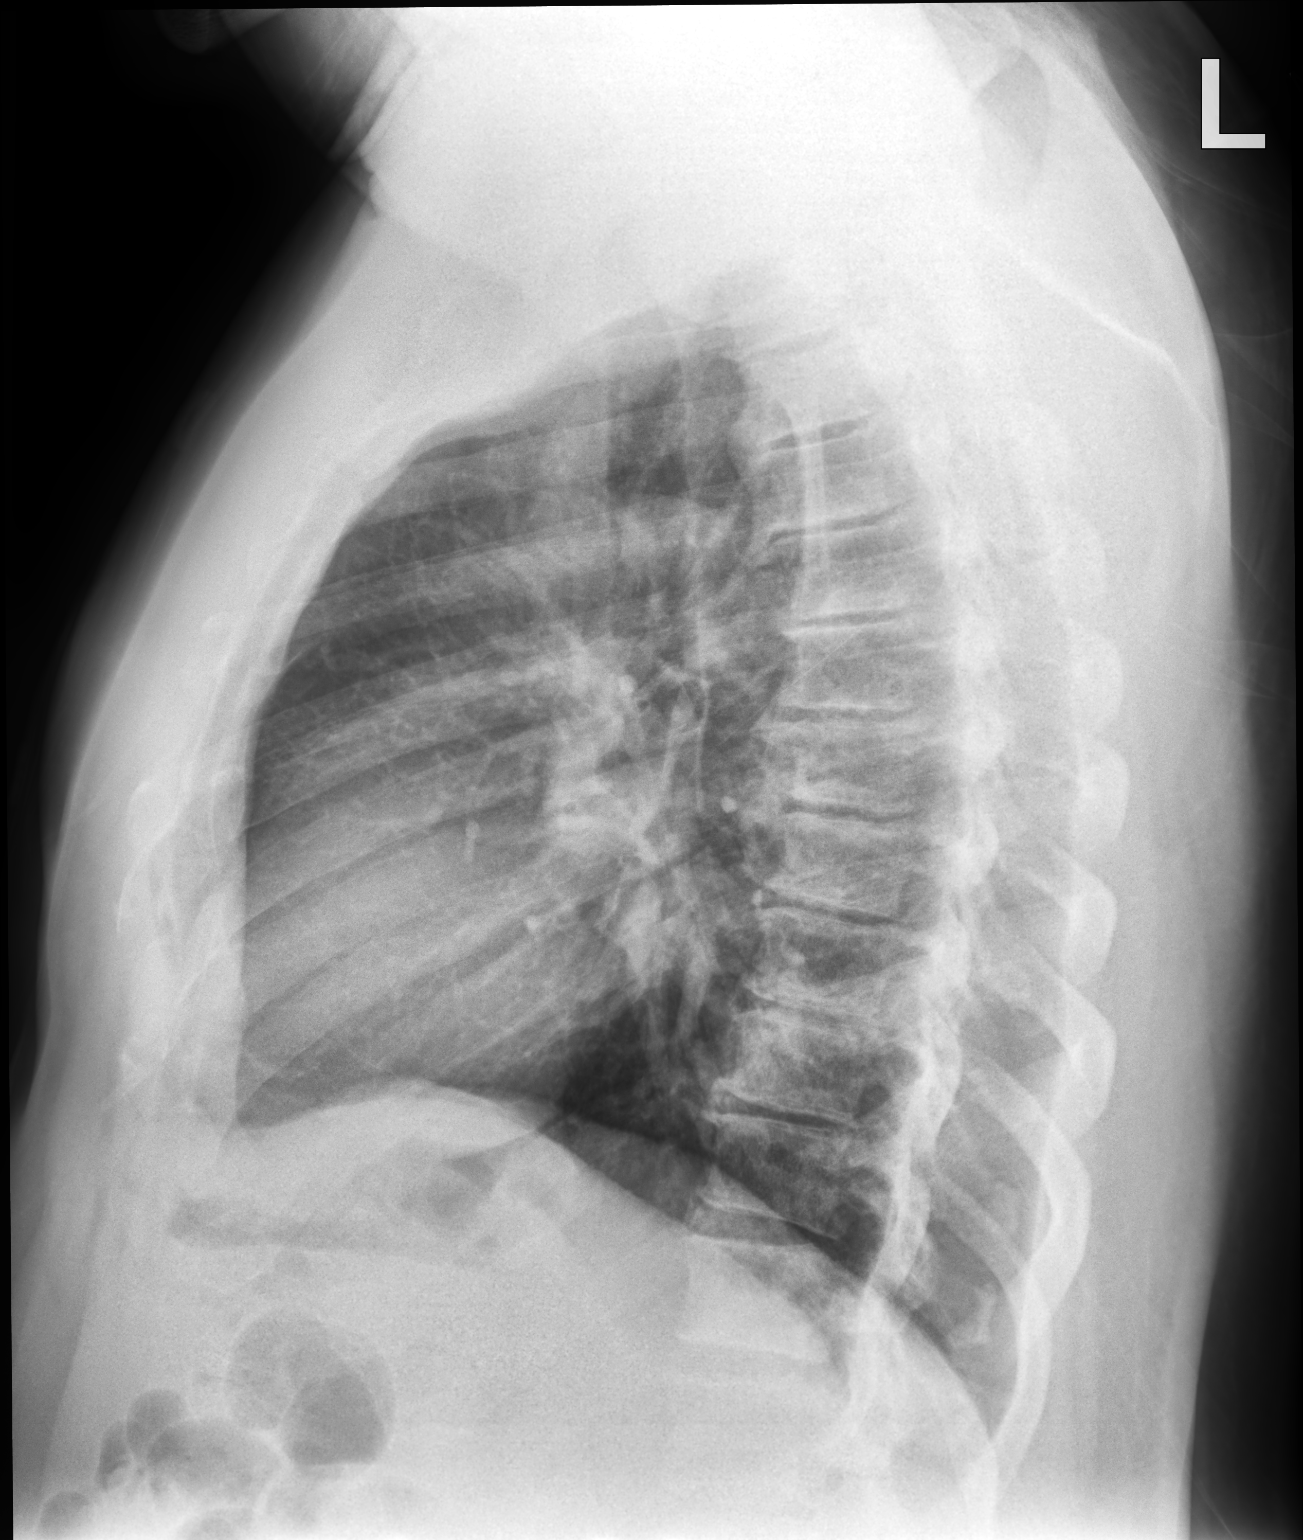

[2 of 2 positions shown; findings below may reference images not displayed]

FINDINGS: Normal heart, mediastinum and hila.

Clear lungs.  No pleural effusion or pneumothorax.

Skeletal structures are intact.
IMPRESSION: No active cardiopulmonary disease.
